# Patient Record
Sex: Male | Born: 1945 | Race: White | Hispanic: No | Marital: Married | Smoking: Former smoker
Health system: Southern US, Community
[De-identification: ages and names within clinical notes are randomized; demographics above are authoritative.]

## PROBLEM LIST (undated history)

## (undated) DIAGNOSIS — J449 Chronic obstructive pulmonary disease, unspecified: Secondary | ICD-10-CM

## (undated) DIAGNOSIS — J439 Emphysema, unspecified: Secondary | ICD-10-CM

## (undated) HISTORY — PX: NO PAST SURGERIES: SHX2092

## (undated) HISTORY — DX: Emphysema, unspecified: J43.9

## (undated) HISTORY — DX: Chronic obstructive pulmonary disease, unspecified: J44.9

---

## 2012-04-18 ENCOUNTER — Ambulatory Visit: Payer: Self-pay | Admitting: Family Medicine

## 2012-04-18 ENCOUNTER — Ambulatory Visit: Payer: Self-pay | Admitting: Oncology

## 2013-02-09 ENCOUNTER — Encounter (INDEPENDENT_AMBULATORY_CARE_PROVIDER_SITE_OTHER): Payer: Federal, State, Local not specified - PPO | Admitting: Ophthalmology

## 2013-02-09 DIAGNOSIS — H348192 Central retinal vein occlusion, unspecified eye, stable: Secondary | ICD-10-CM

## 2013-02-09 DIAGNOSIS — H43819 Vitreous degeneration, unspecified eye: Secondary | ICD-10-CM

## 2013-02-09 DIAGNOSIS — H2510 Age-related nuclear cataract, unspecified eye: Secondary | ICD-10-CM

## 2013-02-09 DIAGNOSIS — H35039 Hypertensive retinopathy, unspecified eye: Secondary | ICD-10-CM

## 2013-02-09 DIAGNOSIS — I10 Essential (primary) hypertension: Secondary | ICD-10-CM

## 2013-03-22 DIAGNOSIS — J449 Chronic obstructive pulmonary disease, unspecified: Secondary | ICD-10-CM | POA: Insufficient documentation

## 2013-03-22 DIAGNOSIS — I10 Essential (primary) hypertension: Secondary | ICD-10-CM | POA: Insufficient documentation

## 2013-06-22 DIAGNOSIS — E875 Hyperkalemia: Secondary | ICD-10-CM | POA: Insufficient documentation

## 2014-02-14 IMAGING — CR DG CHEST 2V
1 series · 2 of 2 positions shown · non-contrast
Comparison: none

REASON FOR EXAM: diminished breath sounds bilaterly,smoker ,cpe screening
COMMENTS:

PROCEDURE:     RTOYOTA - RTOYOTA CHEST PA (OR AP) AND LAT  - April 18, 2012 [DATE]
RESULT:     Comparison: None.

[Series 1: pa · 0.17mm/px · 2 of 2 slices shown]
[im 1/2]
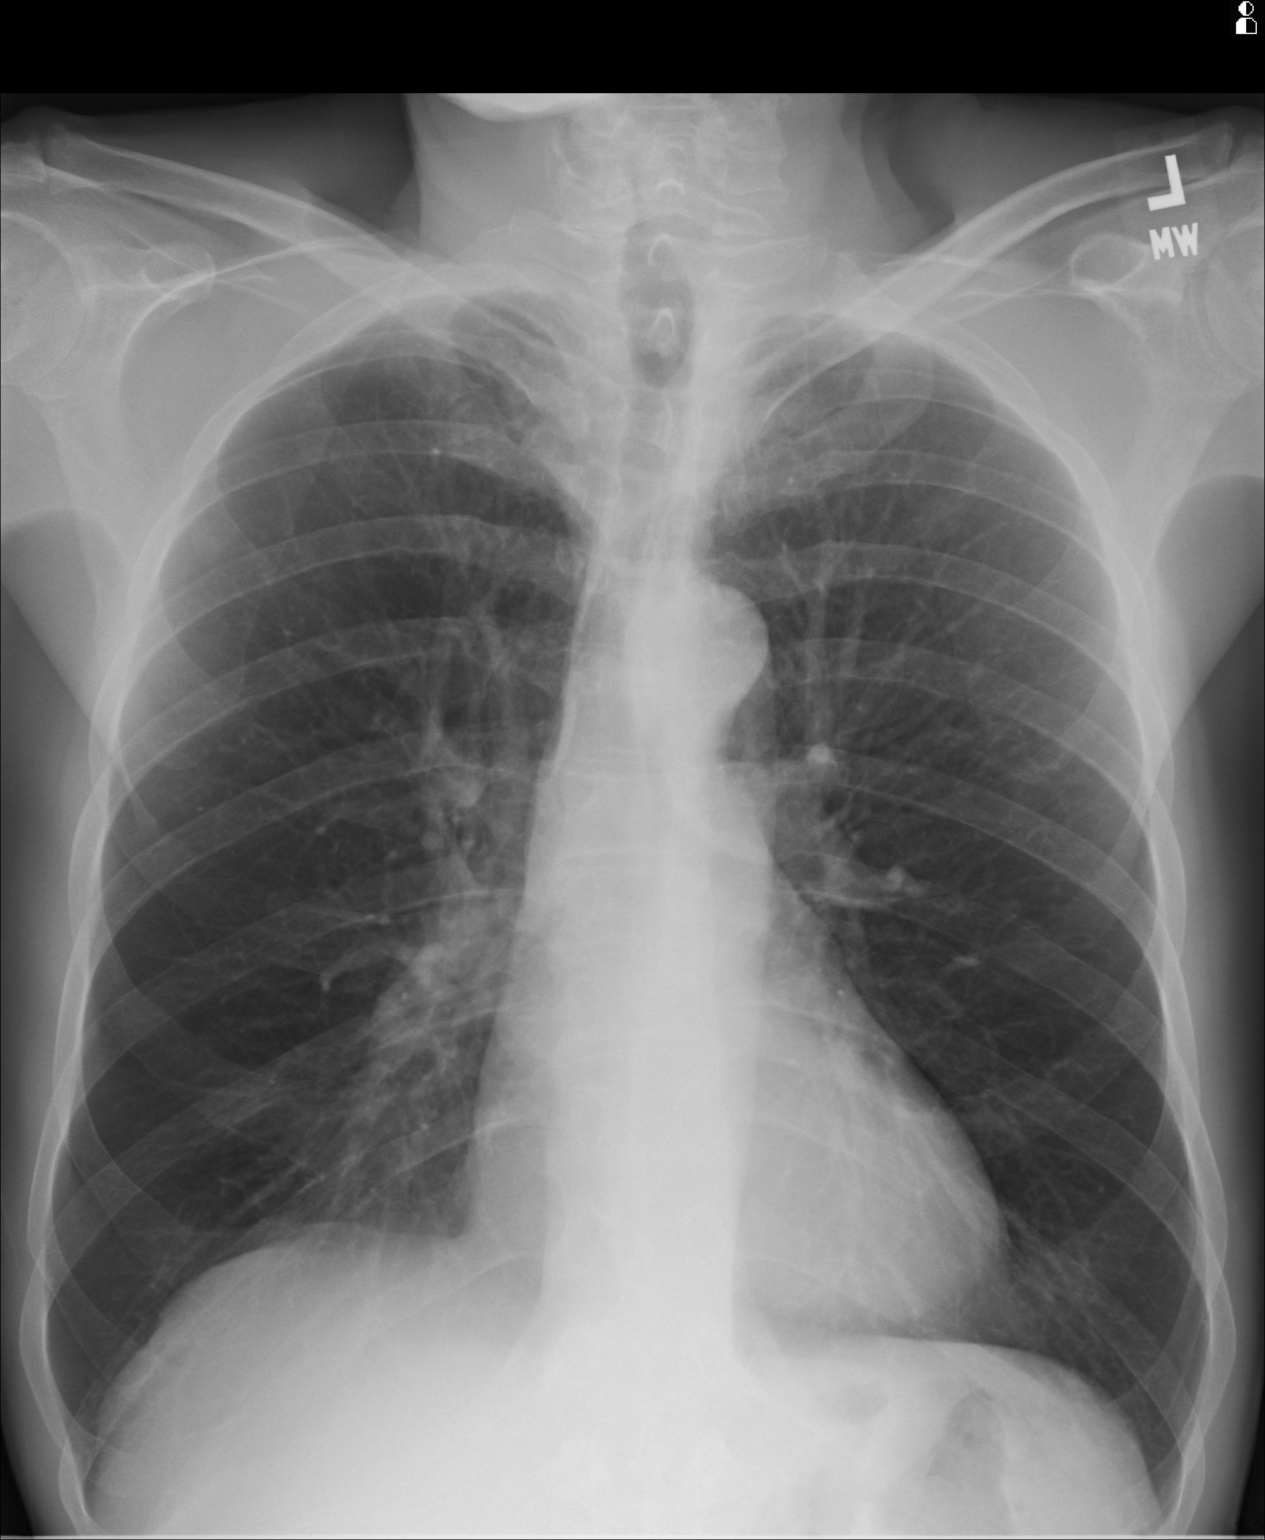
[im 2/2]
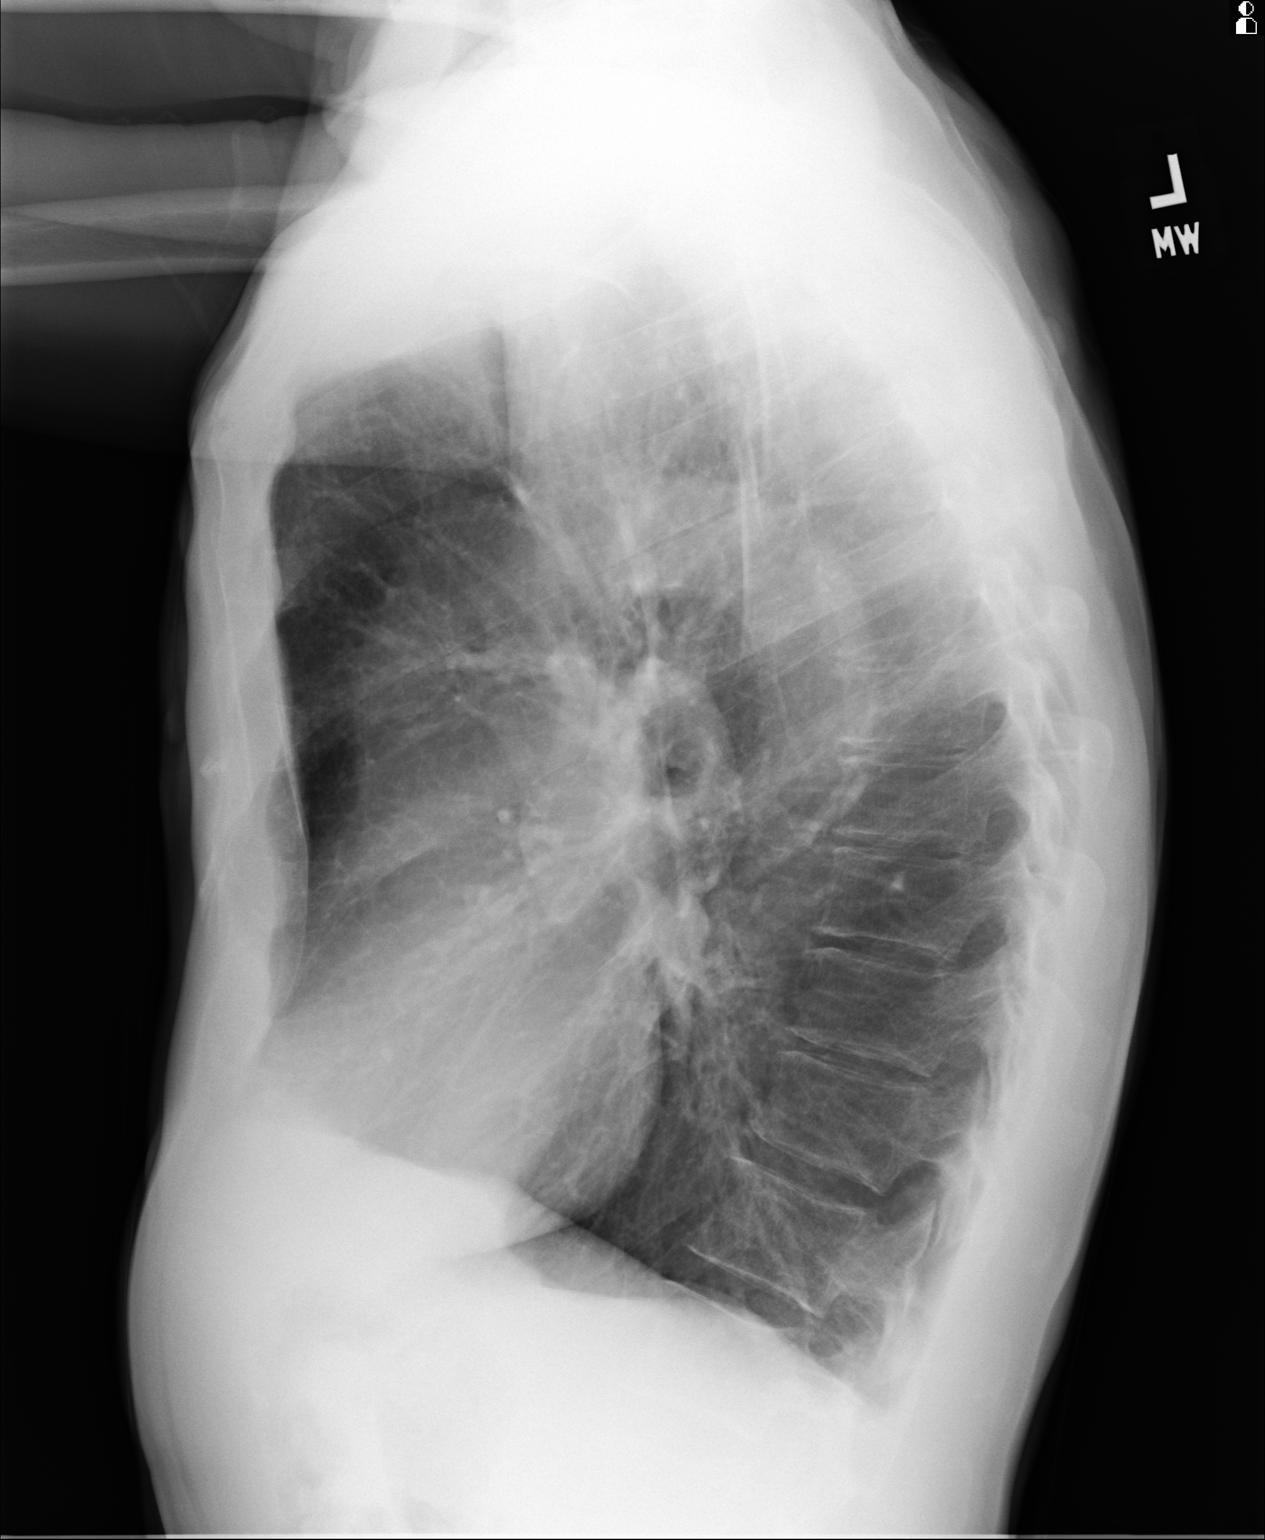

[2 of 2 positions shown; findings below may reference images not displayed]

FINDINGS: The heart and mediastinum are within normal limits. Slight prominence of the
right superior mediastinum is likely secondary to patient rotation. No focal
pulmonary opacities. The lungs are hyperinflated.
IMPRESSION: Hyperinflation suggesting COPD. Otherwise, no acute cardiopulmonary disease.

[REDACTED]

## 2019-11-01 DIAGNOSIS — Z8639 Personal history of other endocrine, nutritional and metabolic disease: Secondary | ICD-10-CM | POA: Insufficient documentation

## 2020-01-13 DIAGNOSIS — Z961 Presence of intraocular lens: Secondary | ICD-10-CM | POA: Insufficient documentation

## 2020-01-21 ENCOUNTER — Other Ambulatory Visit: Payer: Self-pay

## 2020-01-21 DIAGNOSIS — Z20822 Contact with and (suspected) exposure to covid-19: Secondary | ICD-10-CM

## 2020-01-25 LAB — NOVEL CORONAVIRUS, NAA: SARS-CoV-2, NAA: NOT DETECTED

## 2020-05-31 DIAGNOSIS — H2512 Age-related nuclear cataract, left eye: Secondary | ICD-10-CM | POA: Insufficient documentation

## 2020-08-08 ENCOUNTER — Other Ambulatory Visit: Payer: Self-pay | Admitting: Family Medicine

## 2020-08-08 DIAGNOSIS — J449 Chronic obstructive pulmonary disease, unspecified: Secondary | ICD-10-CM

## 2020-09-02 ENCOUNTER — Ambulatory Visit: Payer: Federal, State, Local not specified - PPO

## 2021-02-17 DIAGNOSIS — H348392 Tributary (branch) retinal vein occlusion, unspecified eye, stable: Secondary | ICD-10-CM | POA: Insufficient documentation

## 2021-02-17 DIAGNOSIS — H35351 Cystoid macular degeneration, right eye: Secondary | ICD-10-CM | POA: Insufficient documentation

## 2021-07-09 DIAGNOSIS — R0602 Shortness of breath: Secondary | ICD-10-CM | POA: Insufficient documentation

## 2021-07-09 DIAGNOSIS — R600 Localized edema: Secondary | ICD-10-CM | POA: Insufficient documentation

## 2021-07-09 DIAGNOSIS — I272 Pulmonary hypertension, unspecified: Secondary | ICD-10-CM | POA: Insufficient documentation

## 2021-07-16 ENCOUNTER — Other Ambulatory Visit: Payer: Self-pay | Admitting: Pulmonary Disease

## 2021-07-16 DIAGNOSIS — J449 Chronic obstructive pulmonary disease, unspecified: Secondary | ICD-10-CM

## 2021-07-16 DIAGNOSIS — J9601 Acute respiratory failure with hypoxia: Secondary | ICD-10-CM

## 2021-08-04 ENCOUNTER — Other Ambulatory Visit: Payer: Federal, State, Local not specified - PPO

## 2021-08-18 ENCOUNTER — Ambulatory Visit
Admission: RE | Admit: 2021-08-18 | Discharge: 2021-08-18 | Disposition: A | Discharge status: Home / Self Care | Payer: Federal, State, Local not specified - PPO | Source: Ambulatory Visit | Attending: Pulmonary Disease | Admitting: Pulmonary Disease

## 2021-08-18 DIAGNOSIS — J449 Chronic obstructive pulmonary disease, unspecified: Secondary | ICD-10-CM

## 2021-08-18 DIAGNOSIS — J9601 Acute respiratory failure with hypoxia: Secondary | ICD-10-CM

## 2021-09-05 ENCOUNTER — Other Ambulatory Visit: Payer: Self-pay | Admitting: Pulmonary Disease

## 2021-09-05 DIAGNOSIS — J449 Chronic obstructive pulmonary disease, unspecified: Secondary | ICD-10-CM

## 2021-09-25 ENCOUNTER — Encounter (INDEPENDENT_AMBULATORY_CARE_PROVIDER_SITE_OTHER): Payer: Federal, State, Local not specified - PPO | Admitting: Vascular Surgery

## 2021-10-31 ENCOUNTER — Ambulatory Visit (INDEPENDENT_AMBULATORY_CARE_PROVIDER_SITE_OTHER): Payer: Federal, State, Local not specified - PPO | Admitting: Vascular Surgery

## 2021-10-31 ENCOUNTER — Encounter (INDEPENDENT_AMBULATORY_CARE_PROVIDER_SITE_OTHER): Payer: Self-pay | Admitting: Vascular Surgery

## 2021-10-31 VITALS — BP 124/82 | HR 99 | Resp 16 | Wt 121.0 lb

## 2021-10-31 DIAGNOSIS — I10 Essential (primary) hypertension: Secondary | ICD-10-CM

## 2021-10-31 DIAGNOSIS — I7121 Aneurysm of the ascending aorta, without rupture: Secondary | ICD-10-CM

## 2021-10-31 NOTE — Progress Notes (Signed)
Patient ID: Paul Boyer, male   DOB: [birth year], 76 y.o.   MRN: [id number]  Chief Complaint  Patient presents with   New Patient (Initial Visit)    Ref Lanney Gins consult 4.2cm aneurysmal dilation of ascending thoracic aorta     HPI Paul Boyer is a 76 y.o. male.  I am asked to see the patient by Dr. Lanney Gins for evaluation of an ascending thoracic aortic aneurysm.  Patient underwent a noncontrasted CT scan of the chest which I have independently reviewed.  This was done for other reasons, but there is an incidental finding of an approximately 4.2 cm ascending thoracic aortic aneurysm.  The patient used to be a heavy smoker and previously had difficult to control blood pressure although he no longer smokes and his blood pressure is under much better control.  He has no symptoms referable to the aneurysm.  No back or chest pain or signs of peripheral embolization.   Past Medical History:  Diagnosis Date   COPD (chronic obstructive pulmonary disease) ([place])    Emphysema lung ([facility])        Family History  Problem Relation Age of Onset   Dementia Mother    COPD Father    Cancer Sister      Social History   Tobacco Use   Smoking status: Former    Types: Cigarettes   Smokeless tobacco: Never  Vaping Use   Vaping Use: Never used  Substance Use Topics   Alcohol use: Never   Drug use: Never    No Known Allergies  Current Outpatient Medications  Medication Sig Dispense Refill   albuterol (VENTOLIN HFA) 108 (90 Base) MCG/ACT inhaler Inhale into the lungs.     furosemide (LASIX) 20 MG tablet Take 20 mg by mouth daily.     ipratropium-albuterol (DUONEB) 0.5-2.5 (3) MG/3ML SOLN Inhale into the lungs.     predniSONE (DELTASONE) 5 MG tablet Take 5 mg by mouth daily.     sulfamethoxazole-trimethoprim (BACTRIM DS) 800-160 MG tablet Take 1 tablet by mouth 3 (three) times a week.     Vitamin D, Ergocalciferol, 50000 units CAPS Take 1 capsule by mouth once a week.      No current facility-administered medications for this visit.      REVIEW OF SYSTEMS (Negative unless checked)  Constitutional: [] Weight loss  [] Fever  [] Chills Cardiac: [] Chest pain   [] Chest pressure   [] Palpitations   [] Shortness of breath when laying flat   [] Shortness of breath at rest   [x] Shortness of breath with exertion. Vascular:  [] Pain in legs with walking   [] Pain in legs at rest   [] Pain in legs when laying flat   [] Claudication   [] Pain in feet when walking  [] Pain in feet at rest  [] Pain in feet when laying flat   [] History of DVT   [] Phlebitis   [] Swelling in legs   [] Varicose veins   [] Non-healing ulcers Pulmonary:   [] Uses home oxygen   [] Productive cough   [] Hemoptysis   [] Wheeze  [x] COPD   [] Asthma Neurologic:  [] Dizziness  [] Blackouts   [] Seizures   [] History of stroke   [] History of TIA  [] Aphasia   [] Temporary blindness   [] Dysphagia   [] Weakness or numbness in arms   [] Weakness or numbness in legs Musculoskeletal:  [] Arthritis   [] Joint swelling   [] Joint pain   [] Low back pain Hematologic:  [] Easy bruising  [] Easy bleeding   [] Hypercoagulable state   [] Anemic  [] Hepatitis Gastrointestinal:  []   Blood in stool   [] Vomiting blood  [] Gastroesophageal reflux/heartburn   [] Abdominal pain Genitourinary:  [] Chronic kidney disease   [] Difficult urination  [] Frequent urination  [] Burning with urination   [] Hematuria Skin:  [] Rashes   [] Ulcers   [] Wounds Psychological:  [] History of anxiety   []  History of major depression.    Physical Exam BP 124/82 (BP Location: Right Arm)   Pulse 99   Resp 16   Wt 121 lb (54.9 kg)  Gen:  WD/WN, NAD Head: [place]/AT, No temporalis wasting.  Ear/Nose/Throat: Hearing grossly intact, nares w/o erythema or drainage, oropharynx w/o Erythema/Exudate Eyes: Conjunctiva clear, sclera non-icteric  Neck: trachea midline.  No JVD.  Pulmonary:  Good air movement, respirations not labored, no use of accessory muscles  Cardiac: RRR, no JVD Vascular:   Vessel Right Left  Radial Palpable Palpable                                   Gastrointestinal:. No masses, surgical incisions, or scars. Musculoskeletal: M/S 5/5 throughout.  Extremities without ischemic changes.  No deformity or atrophy. No edema. Neurologic: Sensation grossly intact in extremities.  Symmetrical.  Speech is fluent. Motor exam as listed above. Psychiatric: Judgment intact, Mood & affect appropriate for pt's clinical situation. Dermatologic: No rashes or ulcers noted.  No cellulitis or open wounds.    Radiology No results found.  Labs No results found for this or any previous visit (from the past 2160 hour(s)).  Assessment/Plan:  Thoracic ascending aortic aneurysm [facility]) Patient underwent a noncontrasted CT scan of the chest which I have independently reviewed.  This was done for other reasons, but there is an incidental finding of an approximately 4.2 cm ascending thoracic aortic aneurysm. We had a discussion today about the pathophysiology and natural history of ascending thoracic aortic aneurysm.  At this size, it does not require any intervention or repair.  His blood pressure is now under good control and no longer smokes and those are the most important things for avoiding growth.  We will follow this on an annual basis with CT scan.  Essential (primary) hypertension blood pressure control important in reducing the progression of atherosclerotic disease and aneurysmal growth. On appropriate oral medications.      10/31/2021, 2:39 PM   This note was created with Dragon medical transcription system.  Any errors from dictation are unintentional.

## 2021-10-31 NOTE — Assessment & Plan Note (Signed)
Patient underwent a noncontrasted CT scan of the chest which I have independently reviewed.  This was done for other reasons, but there is an incidental finding of an approximately 4.2 cm ascending thoracic aortic aneurysm. We had a discussion today about the pathophysiology and natural history of ascending thoracic aortic aneurysm.  At this size, it does not require any intervention or repair.  His blood pressure is now under good control and no longer smokes and those are the most important things for avoiding growth.  We will follow this on an annual basis with CT scan.

## 2021-10-31 NOTE — Assessment & Plan Note (Signed)
blood pressure control important in reducing the progression of atherosclerotic disease and aneurysmal growth. On appropriate oral medications.  

## 2021-11-10 ENCOUNTER — Encounter (INDEPENDENT_AMBULATORY_CARE_PROVIDER_SITE_OTHER): Payer: Self-pay

## 2022-02-19 ENCOUNTER — Other Ambulatory Visit: Payer: Self-pay | Admitting: Pulmonary Disease

## 2022-02-19 DIAGNOSIS — J9601 Acute respiratory failure with hypoxia: Secondary | ICD-10-CM

## 2022-03-11 ENCOUNTER — Ambulatory Visit: Payer: Federal, State, Local not specified - PPO | Attending: Pulmonary Disease

## 2022-03-11 DIAGNOSIS — J9612 Chronic respiratory failure with hypercapnia: Secondary | ICD-10-CM | POA: Insufficient documentation

## 2022-03-11 DIAGNOSIS — J9601 Acute respiratory failure with hypoxia: Secondary | ICD-10-CM | POA: Insufficient documentation

## 2022-03-11 LAB — BLOOD GAS, ARTERIAL
Acid-Base Excess: 10.5 mmol/L — ABNORMAL HIGH (ref 0.0–2.0)
Bicarbonate: 36.8 mmol/L — ABNORMAL HIGH (ref 20.0–28.0)
O2 Saturation: 86.9 %
Patient temperature: 37
pCO2 arterial: 53 mmHg — ABNORMAL HIGH (ref 32–48)
pH, Arterial: 7.45 (ref 7.35–7.45)
pO2, Arterial: 53 mmHg — ABNORMAL LOW (ref 83–108)

## 2022-03-17 ENCOUNTER — Ambulatory Visit: Payer: Federal, State, Local not specified - PPO

## 2022-10-19 ENCOUNTER — Telehealth (INDEPENDENT_AMBULATORY_CARE_PROVIDER_SITE_OTHER): Payer: Self-pay

## 2022-10-19 NOTE — Telephone Encounter (Addendum)
Paul Boyer called requesting [facility]'s records from his last visit 10/31/2021 for [facility]. They are wanting proof of his Dx. Paul Boyer will come and pick them up 10/21/2022

## 2022-10-20 NOTE — Telephone Encounter (Signed)
Patient spouse will be coming by on 10/21/2022 to pick up notes

## 2022-10-27 ENCOUNTER — Ambulatory Visit
Admission: RE | Admit: 2022-10-27 | Discharge: 2022-10-27 | Disposition: A | Discharge status: Home / Self Care | Payer: Federal, State, Local not specified - PPO | Source: Ambulatory Visit | Attending: Vascular Surgery | Admitting: Vascular Surgery

## 2022-10-27 DIAGNOSIS — I7121 Aneurysm of the ascending aorta, without rupture: Secondary | ICD-10-CM | POA: Diagnosis present

## 2022-10-27 MED ORDER — IOHEXOL 350 MG/ML SOLN
80.0000 mL | Freq: Once | INTRAVENOUS | Status: AC | PRN
  Administered 2022-10-27: 80 mL via INTRAVENOUS

## 2022-11-06 ENCOUNTER — Ambulatory Visit (INDEPENDENT_AMBULATORY_CARE_PROVIDER_SITE_OTHER): Payer: Federal, State, Local not specified - PPO | Admitting: Vascular Surgery

## 2022-11-06 ENCOUNTER — Encounter (INDEPENDENT_AMBULATORY_CARE_PROVIDER_SITE_OTHER): Payer: Self-pay | Admitting: Vascular Surgery

## 2022-11-06 VITALS — BP 161/86 | HR 101 | Resp 18 | Ht 68.0 in | Wt 128.0 lb

## 2022-11-06 DIAGNOSIS — I7121 Aneurysm of the ascending aorta, without rupture: Secondary | ICD-10-CM | POA: Diagnosis not present

## 2022-11-06 DIAGNOSIS — I10 Essential (primary) hypertension: Secondary | ICD-10-CM | POA: Diagnosis not present

## 2022-11-06 DIAGNOSIS — J449 Chronic obstructive pulmonary disease, unspecified: Secondary | ICD-10-CM | POA: Diagnosis not present

## 2022-11-06 NOTE — Assessment & Plan Note (Signed)
I asked he and his wife to let his pulmonologist know about his CT scan so that they could review them.

## 2022-11-06 NOTE — Assessment & Plan Note (Signed)
blood pressure control important in reducing the progression of atherosclerotic disease and aneurysmal growth. On appropriate oral medications.  

## 2022-11-06 NOTE — Assessment & Plan Note (Signed)
He underwent a CT scan of the chest recently which I have independently reviewed.  This demonstrates a stable 4.1 cm ascending thoracic aortic aneurysm.  This does not appear to have any complicating features and is unchanged in size from his previous study.  This can be followed annually with a CT scan.  See him back at that time.

## 2022-11-06 NOTE — Progress Notes (Signed)
MRN : [id number]  Paul Boyer is a 77 y.o. (1945-09-12) male who presents with chief complaint of  Chief Complaint  Patient presents with   Follow-up    1 year follow up  .  History of Present Illness: Patient returns today in follow up of of his thoracic aortic aneurysm.  He underwent a CT scan of the chest recently which I have independently reviewed.  This demonstrates a stable 4.1 cm ascending thoracic aortic aneurysm.  This does not appear to have any complicating features and is unchanged in size from his previous study.  He does have multiple pulmonary findings on the CT scan and his wife says they actually saw the pulmonologist earlier this week.  Current Outpatient Medications  Medication Sig Dispense Refill   albuterol (VENTOLIN HFA) 108 (90 Base) MCG/ACT inhaler Inhale into the lungs.     furosemide (LASIX) 20 MG tablet Take 20 mg by mouth daily.     ipratropium-albuterol (DUONEB) 0.5-2.5 (3) MG/3ML SOLN Inhale into the lungs.     predniSONE (DELTASONE) 5 MG tablet Take 5 mg by mouth daily.     sulfamethoxazole-trimethoprim (BACTRIM DS) 800-160 MG tablet Take 1 tablet by mouth 3 (three) times a week.     Vitamin D, Ergocalciferol, 50000 units CAPS Take 1 capsule by mouth once a week. (Patient not taking: Reported on 11/06/2022)     No current facility-administered medications for this visit.    Past Medical History:  Diagnosis Date   COPD (chronic obstructive pulmonary disease) ([facility])    Emphysema lung ([facility])     Past Surgical History:  Procedure Laterality Date   NO PAST SURGERIES       Social History   Tobacco Use   Smoking status: Former    Types: Cigarettes   Smokeless tobacco: Never  Vaping Use   Vaping status: Never Used  Substance Use Topics   Alcohol use: Never   Drug use: Never       Family History  Problem Relation Age of Onset   Dementia Mother    COPD Father    Cancer Sister      No Known Allergies   REVIEW OF SYSTEMS  (Negative unless checked)  Constitutional: [] Weight loss  [] Fever  [] Chills Cardiac: [] Chest pain   [] Chest pressure   [] Palpitations   [] Shortness of breath when laying flat   [x] Shortness of breath at rest   [x] Shortness of breath with exertion. Vascular:  [] Pain in legs with walking   [] Pain in legs at rest   [] Pain in legs when laying flat   [] Claudication   [] Pain in feet when walking  [] Pain in feet at rest  [] Pain in feet when laying flat   [] History of DVT   [] Phlebitis   [] Swelling in legs   [] Varicose veins   [] Non-healing ulcers Pulmonary:   [] Uses home oxygen   [] Productive cough   [] Hemoptysis   [] Wheeze  [x] COPD   [] Asthma Neurologic:  [] Dizziness  [] Blackouts   [] Seizures   [] History of stroke   [] History of TIA  [] Aphasia   [] Temporary blindness   [] Dysphagia   [] Weakness or numbness in arms   [] Weakness or numbness in legs Musculoskeletal:  [] Arthritis   [] Joint swelling   [] Joint pain   [] Low back pain Hematologic:  [] Easy bruising  [] Easy bleeding   [] Hypercoagulable state   [] Anemic   Gastrointestinal:  [] Blood in stool   [] Vomiting blood  [] Gastroesophageal reflux/heartburn   [] Abdominal pain Genitourinary:  [] Chronic  kidney disease   [] Difficult urination  [] Frequent urination  [] Burning with urination   [] Hematuria Skin:  [] Rashes   [] Ulcers   [] Wounds Psychological:  [] History of anxiety   []  History of major depression.  Physical Examination  BP (!) 161/86 (BP Location: Right Arm)   Pulse (!) 101   Resp 18   Ht 5\' 8"  (1.727 m)   Wt 128 lb (58.1 kg)   BMI 19.46 kg/m  Gen:  WD/WN, NAD Head: [place]/AT, No temporalis wasting. Ear/Nose/Throat: Hearing grossly intact, nares w/o erythema or drainage Eyes: Conjunctiva clear. Sclera non-icteric Neck: Supple.  Trachea midline Pulmonary:  Good air movement, no use of accessory muscles.  Cardiac: RRR, no JVD Vascular:  Vessel Right Left  Radial Palpable Palpable               Musculoskeletal: M/S 5/5 throughout.  No  deformity or atrophy. No edema. Neurologic: Sensation grossly intact in extremities.  Symmetrical.  Speech is fluent.  Psychiatric: Judgment intact, Mood & affect appropriate for pt's clinical situation. Dermatologic: No rashes or ulcers noted.  No cellulitis or open wounds.      Labs No results found for this or any previous visit (from the past 2160 hour(s)).  Radiology CT ANGIO CHEST AORTA W/CM & OR WO/CM  Result Date: 11/02/2022 CLINICAL DATA:  Follow-up ascending thoracic aortic aneurysm, previously measured at 4.2 cm. Also need to follow-up 8 mm left cardiophrenic angle pulmonary nodule on the last CT. EXAM: CT ANGIOGRAPHY CHEST WITH CONTRAST TECHNIQUE: Multidetector CT imaging of the chest was performed using the standard protocol during bolus administration of intravenous contrast. Multiplanar CT image reconstructions and MIPs were obtained to evaluate the vascular anatomy. RADIATION DOSE REDUCTION: This exam was performed according to the departmental dose-optimization program which includes automated exposure control, adjustment of the mA and/or kV according to patient size and/or use of iterative reconstruction technique. CONTRAST:  80mL OMNIPAQUE IOHEXOL 350 MG/ML SOLN COMPARISON:  Chest CT without contrast 08/18/2021. FINDINGS: Cardiovascular: The cardiac size is normal. Minimal pericardial effusion again collects anteriorly. There is moderate to heavy three-vessel coronary artery calcification. The pulmonary veins are normal caliber. The pulmonary trunk is again prominent measuring 3.1 cm indicating arterial hypertension. Pulmonary arteries are clear through the segmental divisions on this nondedicated exam. There are mild patchy calcific plaques of the arch and descending aorta, scattered calcification across the aortic valve leaflets. Once again the aortic root measures 4 cm at the sinuses of Valsalva. Maximum AP and transverse diameter of the ascending thoracic aorta is 3.8 cm. The  remainder of the thoracic aorta is within normal caliber limits. There are scattered plaques in the great vessels without great vessel stenosis. Mediastinum/Nodes: There are calcified mediastinal and left hilar lymph nodes. No noncalcified intrathoracic adenopathy is seen. The thyroid gland and axillary spaces are unremarkable. The thoracic esophagus and thoracic trachea are unremarkable. The right main bronchus is clear. There are layering secretions versus aspirate in the left main bronchus. Lungs/Pleura: Moderate to advanced emphysematous disease is again noted with centrilobular changes predominating. There is a calcified left lower lobe granuloma. There are chronic pleuroparenchymal scar-like opacities both apices, diffuse bronchial thickening. Posterior basal segmental and subsegmental bronchial plugging has increased in the right lower lobe. There is increased linear subsegmental right lower lobe atelectasis. There is no consolidation or effusion. There is a stable 8 mm pleural-based noncalcified nodule in the lingular base on 6:136. In the anterolateral extreme left lower lobe base on 6:150, there is new demonstration  of a 1.3 cm pleural-based noncalcified nodule. This could be a true nodule or nodular atelectasis. There are occasional peripheral centrilobular micronodules in the upper lobes consistent with respiratory bronchiolitis. Upper Abdomen: No acute abnormality. Abdominal aortic atherosclerosis. Musculoskeletal: Osteopenia with mild thoracic kyphosis. Mild thoracic spine degenerative change. No acute or other significant osseous findings. No chest wall mass. Review of the MIP images confirms the above findings. IMPRESSION: 1. Aortic and coronary artery atherosclerosis. 2. Aortic valve leaflet calcifications. Consider echocardiographic follow-up. 3. Prominent pulmonary trunk indicating arterial hypertension. 4. 4 cm aortic root, with maximum ascending aortic caliber 3.8 cm. Recommend annual imaging  followup by CTA or MRA. This recommendation follows 2010 ACCF/AHA/AATS/ ACR/ASA/ SCA/SCAI/SIR/STS/SVM Guidelines for the Diagnosis and Management of Patients with Thoracic Aortic Disease. Circulation. 2010; 121: U981-X914. Aortic aneurysm NOS (ICD10-I71.9). 5. Moderate to advanced emphysema with diffuse bronchial thickening. 6. Increased bronchial plugging in the right lower lobe. No focal pneumonia. Increased downstream atelectasis. 7. Stable 8 mm lingular base nodule. 8. New 1.3 cm pleural-based noncalcified nodule in the anterolateral left lower lobe base. This could be a true nodule or nodular atelectasis. Consider one of the following in 3 months for both low-risk and high-risk individuals: (a) repeat chest CT, (b) follow-up PET-CT, or (c) tissue sampling. This recommendation follows the consensus statement: Guidelines for Management of Incidental Pulmonary Nodules Detected on CT Images: From the Fleischner Society 2017; Radiology 2017; 284:228-243. 9. Occasional peripheral centrilobular micronodules in the upper lobes consistent with respiratory bronchiolitis. 10. Layering secretions versus aspirate in the left main bronchus. 11. Osteopenia and degenerative change. Aortic Atherosclerosis (ICD10-I70.0) and Emphysema (ICD10-J43.9). Electronically Signed   By: Almira Bar M.D.   On: 11/02/2022 02:41    Assessment/Plan  Thoracic ascending aortic aneurysm [facility]) He underwent a CT scan of the chest recently which I have independently reviewed.  This demonstrates a stable 4.1 cm ascending thoracic aortic aneurysm.  This does not appear to have any complicating features and is unchanged in size from his previous study.  This can be followed annually with a CT scan.  See him back at that time.  Essential (primary) hypertension blood pressure control important in reducing the progression of atherosclerotic disease and aneurysmal growth. On appropriate oral medications.   COPD (chronic obstructive pulmonary  disease) ([facility]) I asked he and his wife to let his pulmonologist know about his CT scan so that they could review them.    Festus Barren, MD  11/06/2022 11:33 AM    This note was created with Dragon medical transcription system.  Any errors from dictation are purely unintentional

## 2023-06-01 ENCOUNTER — Encounter (INDEPENDENT_AMBULATORY_CARE_PROVIDER_SITE_OTHER): Payer: Self-pay

## 2023-07-06 ENCOUNTER — Encounter (INDEPENDENT_AMBULATORY_CARE_PROVIDER_SITE_OTHER): Payer: Self-pay | Admitting: Vascular Surgery

## 2023-07-06 NOTE — Telephone Encounter (Signed)
 I called and spoke with the patient's wife this morning.  Based on the patient's pictures there is no concern for acute arterial insufficiency.  Following discussion with the wife the patient sits in a dependent position most of the day and he also sleeps in a semidependent position.  The discoloration is likely related to the swelling that he currently has and likely some venous insufficiency.  His feet are warm currently as well.  I have recommended that they utilize medical grade compression and they try to elevate his lower extremities as this will be helpful with controlling his swelling will likely help his discoloration as well.  We discussed signs and symptoms that may require emergency room visit such as a cold lower extremity with severely darkened, such as black or deep purple discoloration of the feet and toe area.  He will plan on keeping his follow-up as previously scheduled unless issues arise sooner.

## 2023-10-27 ENCOUNTER — Ambulatory Visit
Admission: RE | Admit: 2023-10-27 | Discharge: 2023-10-27 | Disposition: A | Discharge status: Home / Self Care | Source: Ambulatory Visit | Attending: Vascular Surgery | Admitting: Vascular Surgery

## 2023-10-27 DIAGNOSIS — I7121 Aneurysm of the ascending aorta, without rupture: Secondary | ICD-10-CM | POA: Diagnosis present

## 2023-10-27 MED ORDER — IOHEXOL 350 MG/ML SOLN
75.0000 mL | Freq: Once | INTRAVENOUS | Status: AC | PRN
  Administered 2023-10-27: 75 mL via INTRAVENOUS

## 2023-11-09 ENCOUNTER — Telehealth (INDEPENDENT_AMBULATORY_CARE_PROVIDER_SITE_OTHER): Payer: Self-pay

## 2023-11-09 NOTE — Telephone Encounter (Signed)
 Call to patient advised wife of msg below she is in agreement with the plan for him to be seen next year,  will forward to front office for rescheduling of appointment for next year with CT

## 2023-11-09 NOTE — Telephone Encounter (Signed)
 Unfortunately we do not do phone all visits, however his CT showed no significant growth, so that would be ok if he follows up next year with repeat CT

## 2023-11-09 NOTE — Telephone Encounter (Signed)
 Wife called into nurse line wanting to know if patients next appointment on the 11th for results can be a phone call. She reports he has been having increasing difficulty getting to appointments. She reports no matter the result nothing can be done as far as she is aware and hopes this will be possible for them. Please advise.

## 2023-11-23 ENCOUNTER — Ambulatory Visit (INDEPENDENT_AMBULATORY_CARE_PROVIDER_SITE_OTHER): Admitting: Vascular Surgery
